# Patient Record
Sex: Male | Born: 1965 | Hispanic: Yes | Marital: Married
Health system: Southern US, Community
[De-identification: ages and names within clinical notes are randomized; demographics above are authoritative.]

## PROBLEM LIST (undated history)

## (undated) DIAGNOSIS — E785 Hyperlipidemia, unspecified: Secondary | ICD-10-CM

## (undated) HISTORY — PX: FOOT SURGERY: SHX648

---

## 2014-03-23 ENCOUNTER — Ambulatory Visit: Payer: Self-pay | Admitting: Family Medicine

## 2014-03-23 LAB — DOT URINE DIP
GLUCOSE, UR: NEGATIVE
PROTEIN: NEGATIVE
SPECIFIC GRAVITY: 1.01 (ref 1.000–1.030)

## 2018-03-18 ENCOUNTER — Emergency Department
Admission: EM | Admit: 2018-03-18 | Discharge: 2018-03-18 | Disposition: A | Discharge status: Home / Self Care | Payer: Worker's Compensation | Attending: Emergency Medicine | Admitting: Emergency Medicine

## 2018-03-18 ENCOUNTER — Emergency Department: Payer: Worker's Compensation

## 2018-03-18 ENCOUNTER — Other Ambulatory Visit: Payer: Self-pay

## 2018-03-18 ENCOUNTER — Encounter: Payer: Self-pay | Admitting: Emergency Medicine

## 2018-03-18 DIAGNOSIS — M546 Pain in thoracic spine: Secondary | ICD-10-CM | POA: Diagnosis not present

## 2018-03-18 DIAGNOSIS — Y990 Civilian activity done for income or pay: Secondary | ICD-10-CM | POA: Insufficient documentation

## 2018-03-18 DIAGNOSIS — G44319 Acute post-traumatic headache, not intractable: Secondary | ICD-10-CM | POA: Diagnosis not present

## 2018-03-18 DIAGNOSIS — Y9263 Factory as the place of occurrence of the external cause: Secondary | ICD-10-CM | POA: Insufficient documentation

## 2018-03-18 DIAGNOSIS — Y9389 Activity, other specified: Secondary | ICD-10-CM | POA: Insufficient documentation

## 2018-03-18 DIAGNOSIS — W228XXA Striking against or struck by other objects, initial encounter: Secondary | ICD-10-CM | POA: Diagnosis not present

## 2018-03-18 DIAGNOSIS — S1093XA Contusion of unspecified part of neck, initial encounter: Secondary | ICD-10-CM

## 2018-03-18 DIAGNOSIS — Z79899 Other long term (current) drug therapy: Secondary | ICD-10-CM | POA: Diagnosis not present

## 2018-03-18 DIAGNOSIS — S1083XA Contusion of other specified part of neck, initial encounter: Secondary | ICD-10-CM | POA: Diagnosis not present

## 2018-03-18 DIAGNOSIS — S161XXA Strain of muscle, fascia and tendon at neck level, initial encounter: Secondary | ICD-10-CM | POA: Diagnosis not present

## 2018-03-18 DIAGNOSIS — S0990XA Unspecified injury of head, initial encounter: Secondary | ICD-10-CM | POA: Diagnosis present

## 2018-03-18 HISTORY — DX: Hyperlipidemia, unspecified: E78.5

## 2018-03-18 MED ORDER — METHOCARBAMOL 500 MG PO TABS: 500.0000 mg | ORAL_TABLET | Freq: Four times a day (QID) | ORAL | 0 refills | Status: AC

## 2018-03-18 MED ORDER — MELOXICAM 15 MG PO TABS: 15.0000 mg | ORAL_TABLET | Freq: Every day | ORAL | 0 refills | Status: AC

## 2018-03-18 NOTE — ED Provider Notes (Signed)
[facility] Emergency Department Provider Note  ____________________________________________  Time seen: Approximately 3:05 PM  I have reviewed the triage vital signs and the nursing notes.   HISTORY  Chief Complaint Head Injury    HPI Andrew Weber is a 52 y.o. male who presents the emergency department complaining of headache, neck pain, mid back pain after injury at work.  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  Patient had a metal gate rolled down on top of him while he was underneath.  Patient reports that it struck him in the back of the neck.  Since then he has had a headache, neck pain, radicular symptoms of the left arm, mid back pain.  Patient denies any loss of consciousness, no medications for this complaint prior to arrival.  Patient denies any loss of strength bilateral upper extremities.  No shortness of breath, cough, difficulty breathing.  No other injury from this occurrence reported at this time.    Past Medical History:  Diagnosis Date  . Hyperlipidemia     There are no active problems to display for this patient.   Past Surgical History:  Procedure Laterality Date  . FOOT SURGERY      Prior to Admission medications   Medication Sig Start Date End Date Taking? Authorizing Provider  simvastatin (ZOCOR) 20 MG tablet Take 20 mg by mouth daily.   Yes [provider]  meloxicam (MOBIC) 15 MG tablet Take 1 tablet (15 mg total) by mouth daily. 03/18/18   Cuthriell, Delorise Royals, PA-C  methocarbamol (ROBAXIN) 500 MG tablet Take 1 tablet (500 mg total) by mouth 4 (four) times daily. 03/18/18   Cuthriell, Delorise Royals, PA-C    Allergies Patient has no known allergies.  History reviewed. No pertinent family history.  Social History Social History   Tobacco Use  . Smoking status: Never Smoker  . Smokeless tobacco: Never Used  Substance Use Topics  . Alcohol use: Never    Frequency: Never  .  Drug use: Never     Review of Systems  Constitutional: No fever/chills Eyes: No visual changes. Cardiovascular: no chest pain. Respiratory: no cough. No SOB. Gastrointestinal: No abdominal pain.  No nausea, no vomiting.  Musculoskeletal: Positive for neck pain Skin: Negative for rash, abrasions, lacerations, ecchymosis. Neurological: Positive for headache but denies focal weakness or numbness. 10-point ROS otherwise negative.  ____________________________________________   PHYSICAL EXAM:  VITAL SIGNS: ED Triage Vitals  Enc Vitals Group     BP 03/18/18 1431 140/80     Pulse Rate 03/18/18 1431 76     Resp 03/18/18 1431 20     Temp 03/18/18 1431 98.7 F (37.1 C)     Temp Source 03/18/18 1431 Oral     SpO2 03/18/18 1431 97 %     Weight 03/18/18 1432 214 lb (97.1 kg)     Height 03/18/18 1432 5\' 8"  (1.727 m)     Head Circumference --      Peak Flow --      Pain Score 03/18/18 1437 5     Pain Loc --      Pain Edu? --      Excl. in GC? --      Constitutional: Alert and oriented. Well appearing and in no acute distress. Eyes: Conjunctivae are normal. PERRL. EOMI. Head: Atraumatic. ENT:      Ears:       Nose: No congestion/rhinnorhea.      Mouth/Throat: Mucous membranes  are moist.  Neck: No stridor.  Midline cervical spine tenderness to palpation over C5 and C6.  No palpable abnormality or step-off.  No tenderness to palpation bilateral paraspinal muscle groups.  Radial pulse intact bilateral upper extremities.  Sensation intact and equal in all dermatomal distributions bilateral upper extremities..  Cardiovascular: Normal rate, regular rhythm. Normal S1 and S2.  Good peripheral circulation. Respiratory: Normal respiratory effort without tachypnea or retractions. Lungs CTAB. Good air entry to the bases with no decreased or absent breath sounds. Musculoskeletal: Full range of motion to all extremities. No gross deformities appreciated. Neurologic:  Normal speech and language.  No gross focal neurologic deficits are appreciated.  Skin:  Skin is warm, dry and intact. No rash noted. Psychiatric: Mood and affect are normal. Speech and behavior are normal. Patient exhibits appropriate insight and judgement.   ____________________________________________   LABS (all labs ordered are listed, but only abnormal results are displayed)  Labs Reviewed - No data to display ____________________________________________  EKG   ____________________________________________  RADIOLOGY I personally viewed and evaluated these images as part of my medical decision making, as well as reviewing the written report by the radiologist.  Dg Chest 2 View  Result Date: 03/18/2018 CLINICAL DATA:  Pain after hit by gate EXAM: CHEST - 2 VIEW COMPARISON:  None. FINDINGS: Lungs are clear. Heart size and pulmonary vascularity are normal. No adenopathy. No pneumothorax. No bone lesions. IMPRESSION: No abnormality appreciable. Electronically Signed   By: Bretta BangWilliam  Woodruff III M.D.   On: 03/18/2018 15:52   Ct Head Wo Contrast  Result Date: 03/18/2018 CLINICAL DATA:  Head and neck pain hit in back of head at work EXAM: CT HEAD WITHOUT CONTRAST CT CERVICAL SPINE WITHOUT CONTRAST TECHNIQUE: Multidetector CT imaging of the head and cervical spine was performed following the standard protocol without intravenous contrast. Multiplanar CT image reconstructions of the cervical spine were also generated. COMPARISON:  None. FINDINGS: CT HEAD FINDINGS Brain: No evidence of acute infarction, hemorrhage, hydrocephalus, extra-axial collection or mass lesion/mass effect. Vascular: No hyperdense vessel or unexpected calcification. Skull: Normal. Negative for fracture or focal lesion. Sinuses/Orbits: Mild mucosal thickening in the ethmoid sinuses. Other: None CT CERVICAL SPINE FINDINGS Alignment: Straightening of the cervical spine. No subluxation. Facet alignment within normal limits. Skull base and vertebrae: No  acute fracture. No primary bone lesion or focal pathologic process. Soft tissues and spinal canal: No prevertebral fluid or swelling. No visible canal hematoma. Disc levels:  Minimal degenerative change C5-C6 and C6-C7 Upper chest: Negative. Other: None IMPRESSION: 1. Negative non contrasted CT appearance of the brain 2. Straightening of the cervical spine. No acute osseous abnormality. Electronically Signed   By: Jasmine PangKim  Fujinaga M.D.   On: 03/18/2018 15:51   Ct Cervical Spine Wo Contrast  Result Date: 03/18/2018 CLINICAL DATA:  Head and neck pain hit in back of head at work EXAM: CT HEAD WITHOUT CONTRAST CT CERVICAL SPINE WITHOUT CONTRAST TECHNIQUE: Multidetector CT imaging of the head and cervical spine was performed following the standard protocol without intravenous contrast. Multiplanar CT image reconstructions of the cervical spine were also generated. COMPARISON:  None. FINDINGS: CT HEAD FINDINGS Brain: No evidence of acute infarction, hemorrhage, hydrocephalus, extra-axial collection or mass lesion/mass effect. Vascular: No hyperdense vessel or unexpected calcification. Skull: Normal. Negative for fracture or focal lesion. Sinuses/Orbits: Mild mucosal thickening in the ethmoid sinuses. Other: None CT CERVICAL SPINE FINDINGS Alignment: Straightening of the cervical spine. No subluxation. Facet alignment within normal limits. Skull base and vertebrae: No  acute fracture. No primary bone lesion or focal pathologic process. Soft tissues and spinal canal: No prevertebral fluid or swelling. No visible canal hematoma. Disc levels:  Minimal degenerative change C5-C6 and C6-C7 Upper chest: Negative. Other: None IMPRESSION: 1. Negative non contrasted CT appearance of the brain 2. Straightening of the cervical spine. No acute osseous abnormality. Electronically Signed   By: Jasmine PangKim  Fujinaga M.D.   On: 03/18/2018 15:51    ____________________________________________    PROCEDURES  Procedure(s) performed:     Procedures    Medications - No data to display   ____________________________________________   INITIAL IMPRESSION / ASSESSMENT AND PLAN / ED COURSE  Pertinent labs & imaging results that were available during my care of the patient were reviewed by me and considered in my medical decision making (see chart for details).  Review of the [place] CSRS was performed in accordance of the [facility] prior to dispensing any controlled drugs.      Patient's diagnosis is consistent with neck contusion, cervical strain, posttraumatic headache.  Patient presents the emergency department after being injured at work.  Overall, exam is reassuring.  Given patient's symptoms, imaging is obtained.  No acute osseous or intracranial abnormality on imaging.  Patient will be placed on meloxicam and Robaxin for symptom improvement.  Work restrictions given to patient..  Patient will follow primary care as needed.  Patient is given ED precautions to return to the ED for any worsening or new symptoms.     ____________________________________________  FINAL CLINICAL IMPRESSION(S) / ED DIAGNOSES  Final diagnoses:  Contusion of neck, initial encounter  Acute post-traumatic headache, not intractable  Strain of neck muscle, initial encounter      NEW MEDICATIONS STARTED DURING THIS VISIT:  ED Discharge Orders         Ordered    meloxicam (MOBIC) 15 MG tablet  Daily     03/18/18 1624    methocarbamol (ROBAXIN) 500 MG tablet  4 times daily     03/18/18 1624              This chart was dictated using voice recognition software/Dragon. Despite best efforts to proofread, errors can occur which can change the meaning. Any change was purely unintentional.    Racheal PatchesCuthriell, Jonathan D, PA-C 03/18/18 1635    Sharyn CreamerQuale, Mark, MD 03/18/18 2340

## 2018-03-18 NOTE — ED Notes (Signed)
See triage note  States he was hit in back of neck by a metal gate at work  Having pain to neck  Denies any LOC

## 2018-03-18 NOTE — ED Triage Notes (Signed)
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  This RN spoke with Gene Combs from [facility] phone # [phone] who states no lab work needed.  Pt denies LOC.

## 2019-08-03 ENCOUNTER — Ambulatory Visit: Payer: BC Managed Care – PPO | Attending: Internal Medicine

## 2019-08-03 DIAGNOSIS — Z20822 Contact with and (suspected) exposure to covid-19: Secondary | ICD-10-CM

## 2019-08-05 LAB — NOVEL CORONAVIRUS, NAA: SARS-CoV-2, NAA: NOT DETECTED

## 2019-11-21 ENCOUNTER — Ambulatory Visit: Payer: Self-pay | Attending: Internal Medicine

## 2019-11-21 DIAGNOSIS — Z23 Encounter for immunization: Secondary | ICD-10-CM

## 2019-11-21 NOTE — Progress Notes (Signed)
   Covid-19 Vaccination Clinic  Name:  Kyro Joswick    MRN: [id number] DOB: 28-Nov-1965  11/21/2019  Mr. Barot was observed post Covid-19 immunization for 15 minutes   without incident. He was provided with Vaccine Information Sheet and instruction to access the V-Safe system.   Mr. Beagle was instructed to call 911 with any severe reactions post vaccine: Marland Kitchen Difficulty breathing  . Swelling of face and throat  . A fast heartbeat  . A bad rash all over body  . Dizziness and weakness   Immunizations Administered    Name Date Dose VIS Date Route   Pfizer COVID-19 Vaccine 11/21/2019  3:11 PM 0.3 mL 07/17/2019 Intramuscular   Manufacturer: [employer], [employer]   Lot: [id number]   NDC: [address]-4

## 2019-12-12 ENCOUNTER — Ambulatory Visit: Payer: BC Managed Care – PPO | Attending: Internal Medicine

## 2019-12-12 DIAGNOSIS — Z23 Encounter for immunization: Secondary | ICD-10-CM

## 2019-12-12 NOTE — Progress Notes (Signed)
   Covid-19 Vaccination Clinic  Name:  Jobin Montelongo    MRN: [id number] DOB: 10-15-1965  12/12/2019  Mr. Maultsby was observed post Covid-19 immunization for 15 minutes without incident. He was provided with Vaccine Information Sheet and instruction to access the V-Safe system.   Mr. Mcparland was instructed to call 911 with any severe reactions post vaccine: Marland Kitchen Difficulty breathing  . Swelling of face and throat  . A fast heartbeat  . A bad rash all over body  . Dizziness and weakness   Immunizations Administered    Name Date Dose VIS Date Route   Pfizer COVID-19 Vaccine 12/12/2019 12:20 PM 0.3 mL 09/30/2018 Intramuscular   Manufacturer: [employer], [employer]   Lot: [id number]   NDC: [address]-2

## 2019-12-30 IMAGING — CT CT CERVICAL SPINE W/O CM
4 of 7 series · 13 of 33 positions shown, 14 images · non-contrast
Comparison: None.

CLINICAL DATA: Head and neck pain hit in back of head at work

EXAM:
CT HEAD WITHOUT CONTRAST
CT CERVICAL SPINE WITHOUT CONTRAST
TECHNIQUE: Multidetector CT imaging of the head and cervical spine was
performed following the standard protocol without intravenous
contrast. Multiplanar CT image reconstructions of the cervical spine
were also generated.

[Series 7: c spine soft · axial · 0.34mm/px · z∈[-278,-160]mm · 4 of 99 slices shown]
[im 20/99  soft-tissue]
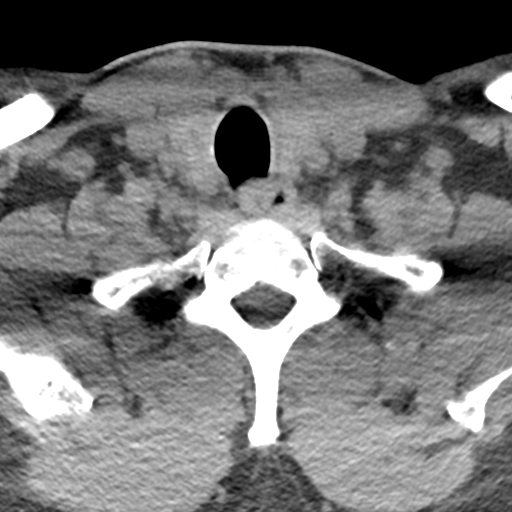
[im 40/99  soft-tissue]
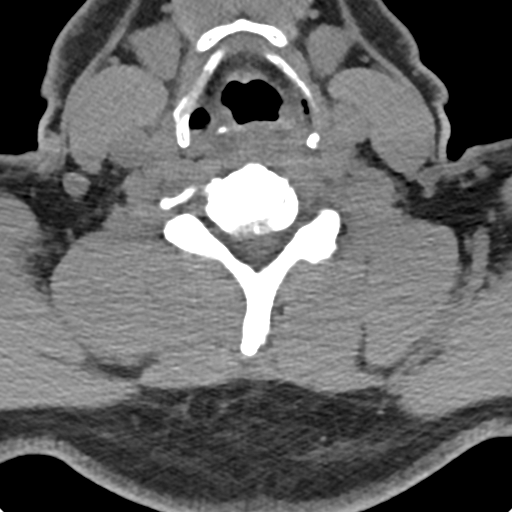
[im 59/99  soft-tissue]
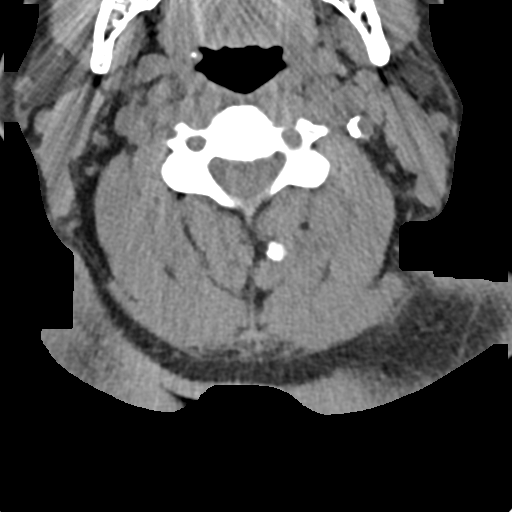
[im 79/99  soft-tissue]
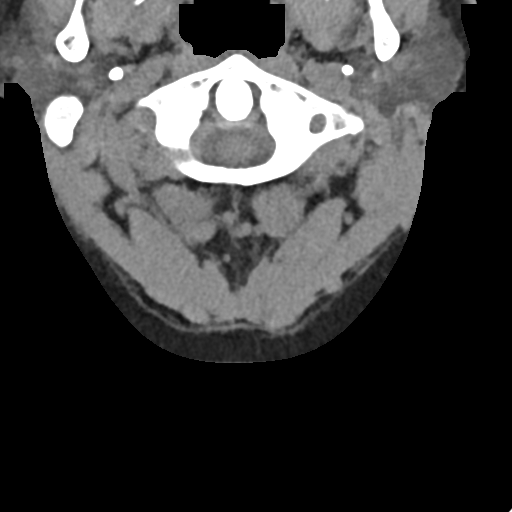

[Series 10: sagittal bone · sagittal · 0.23mm/px · 4 of 56 slices shown]
[im 12/56  bone]
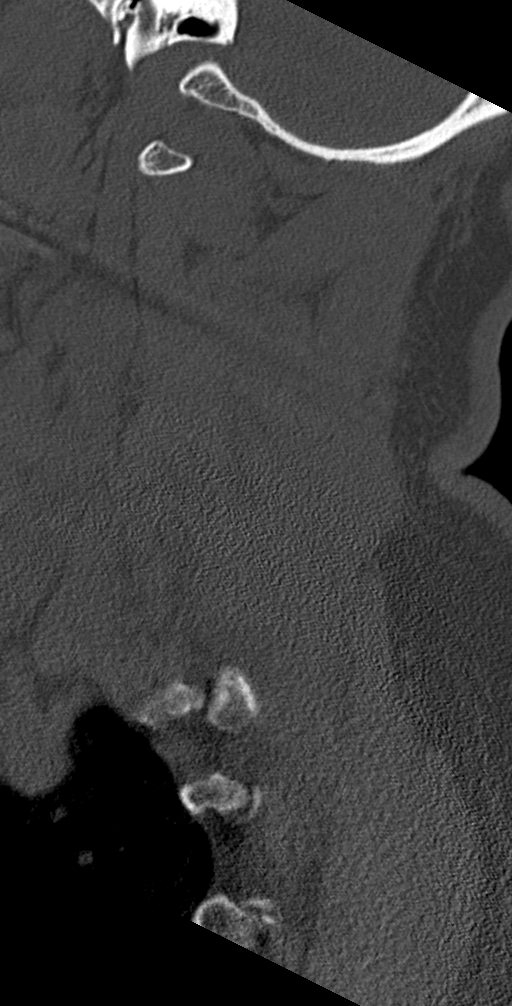
[im 23/56  bone]
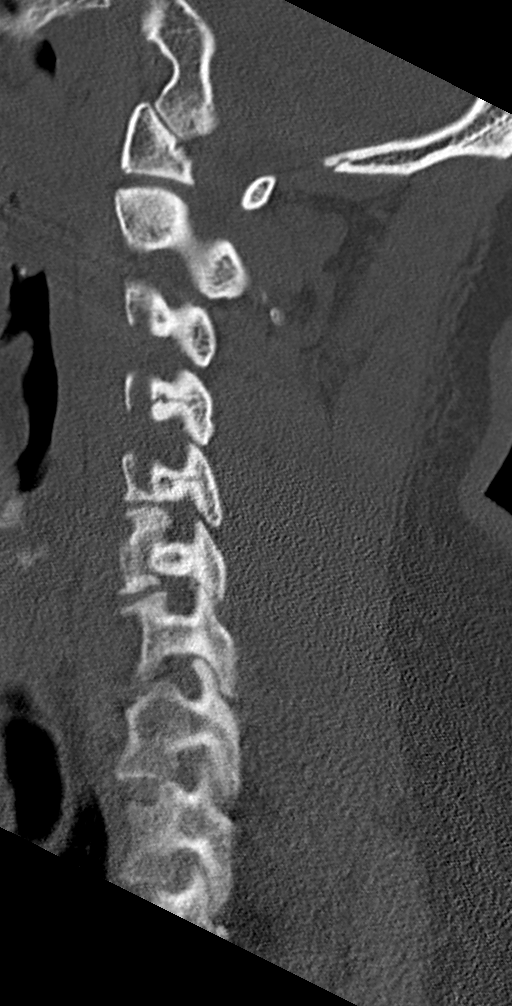
[im 34/56  bone]
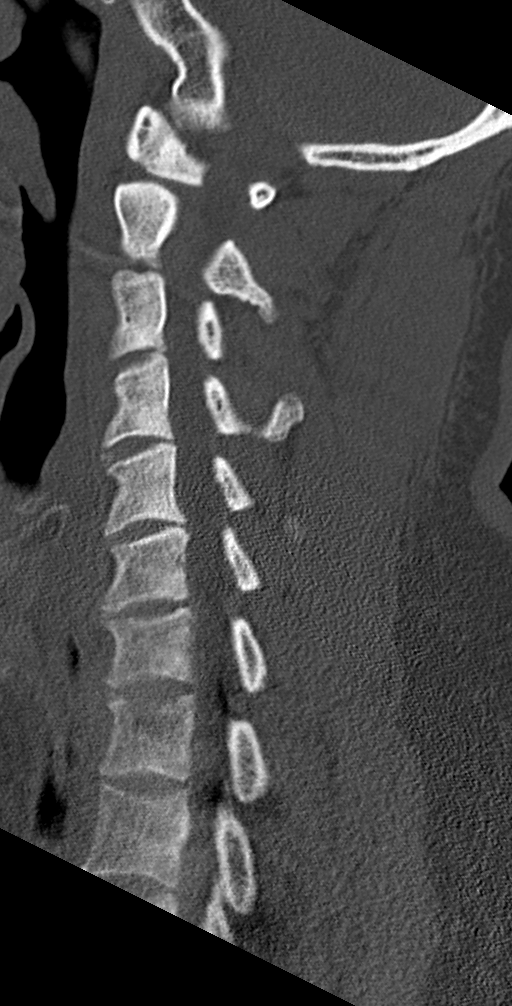
[im 45/56  bone]
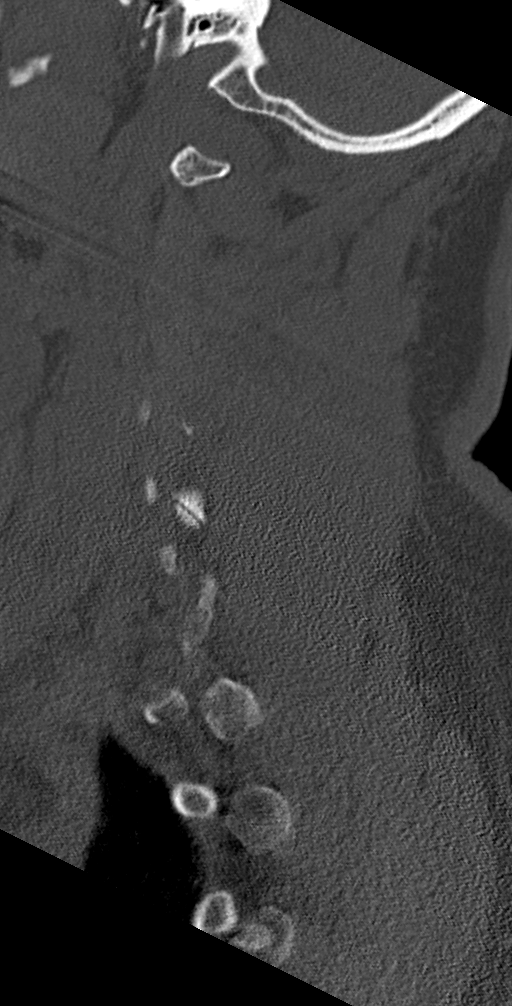

[Series 11: coronal bone · coronal · 0.25mm/px · 1 of 50 slices shown]
[im 25/50  bone]
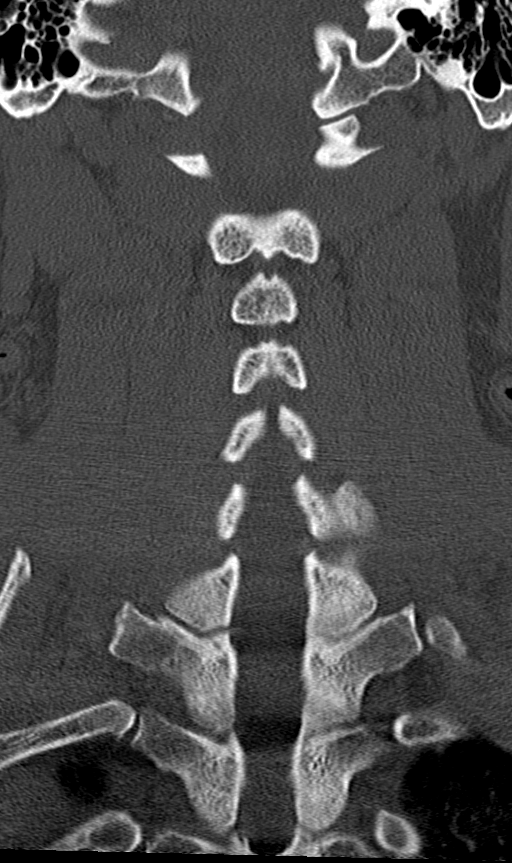

[Series 12: orthogonal bone · axial · 0.22mm/px · z∈[-305,-192]mm · 4 of 105 slices shown, 5 images]
[im 21/105  soft-tissue]
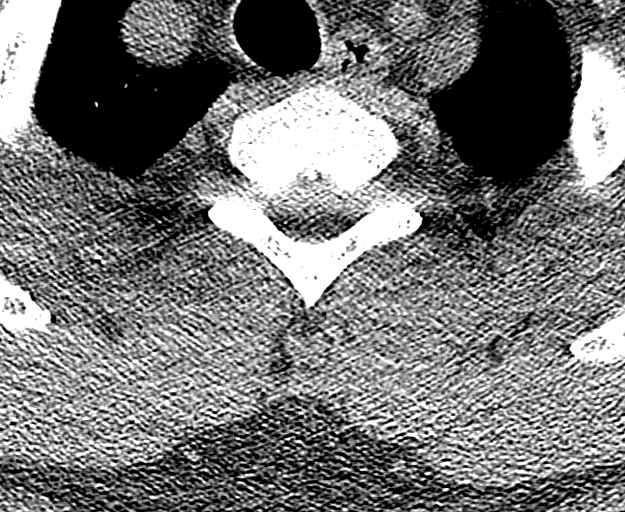
[im 21/105  bone]
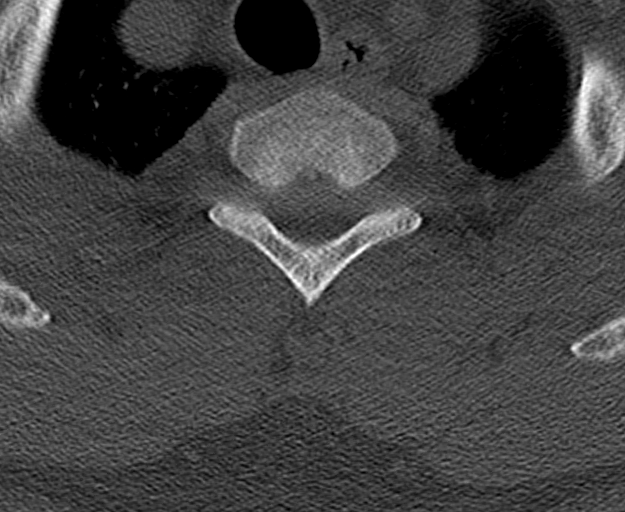
[im 42/105  bone]
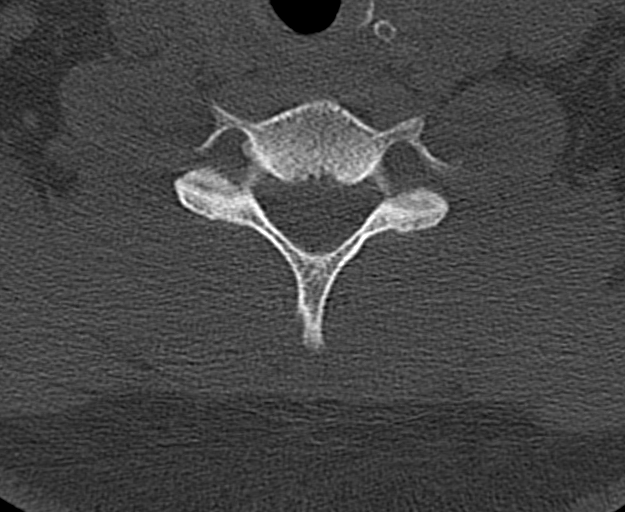
[im 63/105  bone]
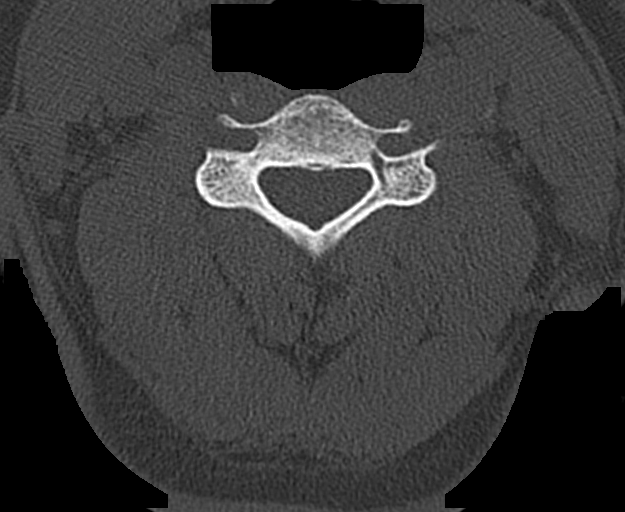
[im 84/105  bone]
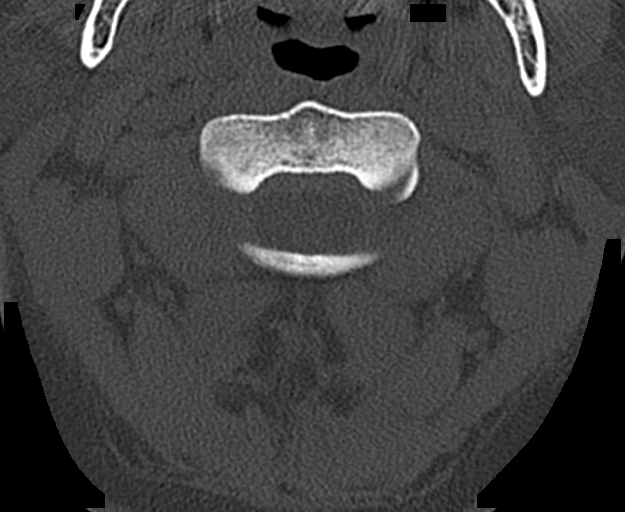

[13 of 33 positions shown; findings below may reference images not displayed]

FINDINGS: CT HEAD FINDINGS

Brain: No evidence of acute infarction, hemorrhage, hydrocephalus,
extra-axial collection or mass lesion/mass effect.

Vascular: No hyperdense vessel or unexpected calcification.

Skull: Normal. Negative for fracture or focal lesion.

Sinuses/Orbits: Mild mucosal thickening in the ethmoid sinuses.

Other: None

CT CERVICAL SPINE FINDINGS

Alignment: Straightening of the cervical spine. No subluxation.
Facet alignment within normal limits.

Skull base and vertebrae: No acute fracture. No primary bone lesion
or focal pathologic process.

Soft tissues and spinal canal: No prevertebral fluid or swelling. No
visible canal hematoma.

Disc levels:  Minimal degenerative change C5-C6 and C6-C7

Upper chest: Negative.

Other: None
IMPRESSION: 1. Negative non contrasted CT appearance of the brain
2. Straightening of the cervical spine. No acute osseous
abnormality.

## 2020-05-26 ENCOUNTER — Ambulatory Visit
Admission: RE | Admit: 2020-05-26 | Discharge: 2020-05-26 | Disposition: A | Discharge status: Home / Self Care | Payer: Worker's Compensation | Source: Ambulatory Visit | Attending: Physician Assistant | Admitting: Physician Assistant

## 2020-05-26 ENCOUNTER — Other Ambulatory Visit: Payer: Self-pay | Admitting: Physician Assistant

## 2020-05-26 ENCOUNTER — Ambulatory Visit
Admission: RE | Admit: 2020-05-26 | Discharge: 2020-05-26 | Disposition: A | Discharge status: Home / Self Care | Payer: Worker's Compensation | Attending: *Deleted | Admitting: *Deleted

## 2020-05-26 ENCOUNTER — Other Ambulatory Visit: Payer: Self-pay

## 2020-05-26 DIAGNOSIS — M25571 Pain in right ankle and joints of right foot: Secondary | ICD-10-CM

## 2021-03-01 DIAGNOSIS — Z139 Encounter for screening, unspecified: Secondary | ICD-10-CM

## 2021-03-01 LAB — GLUCOSE, POCT (MANUAL RESULT ENTRY): POC Glucose: 88 mg/dl (ref 70–99)

## 2021-03-01 NOTE — Congregational Nurse Program (Signed)
  Dept: [phone]   Congregational Nurse Program Note  Date of Encounter: 03/01/2021  Past Medical History: Past Medical History:  Diagnosis Date   Hyperlipidemia     Encounter Details:  CNP Questionnaire - 03/01/21 1344       Questionnaire   Do you give verbal consent to treat you today? 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

## 2022-03-09 IMAGING — CR DG ANKLE COMPLETE 3+V*R*
3 series · 3 of 3 positions shown · non-contrast
Comparison: None.

CLINICAL DATA: Right ankle pain after injury at work today.

EXAM:
RIGHT ANKLE - COMPLETE 3+ VIEW

[ankle ap]
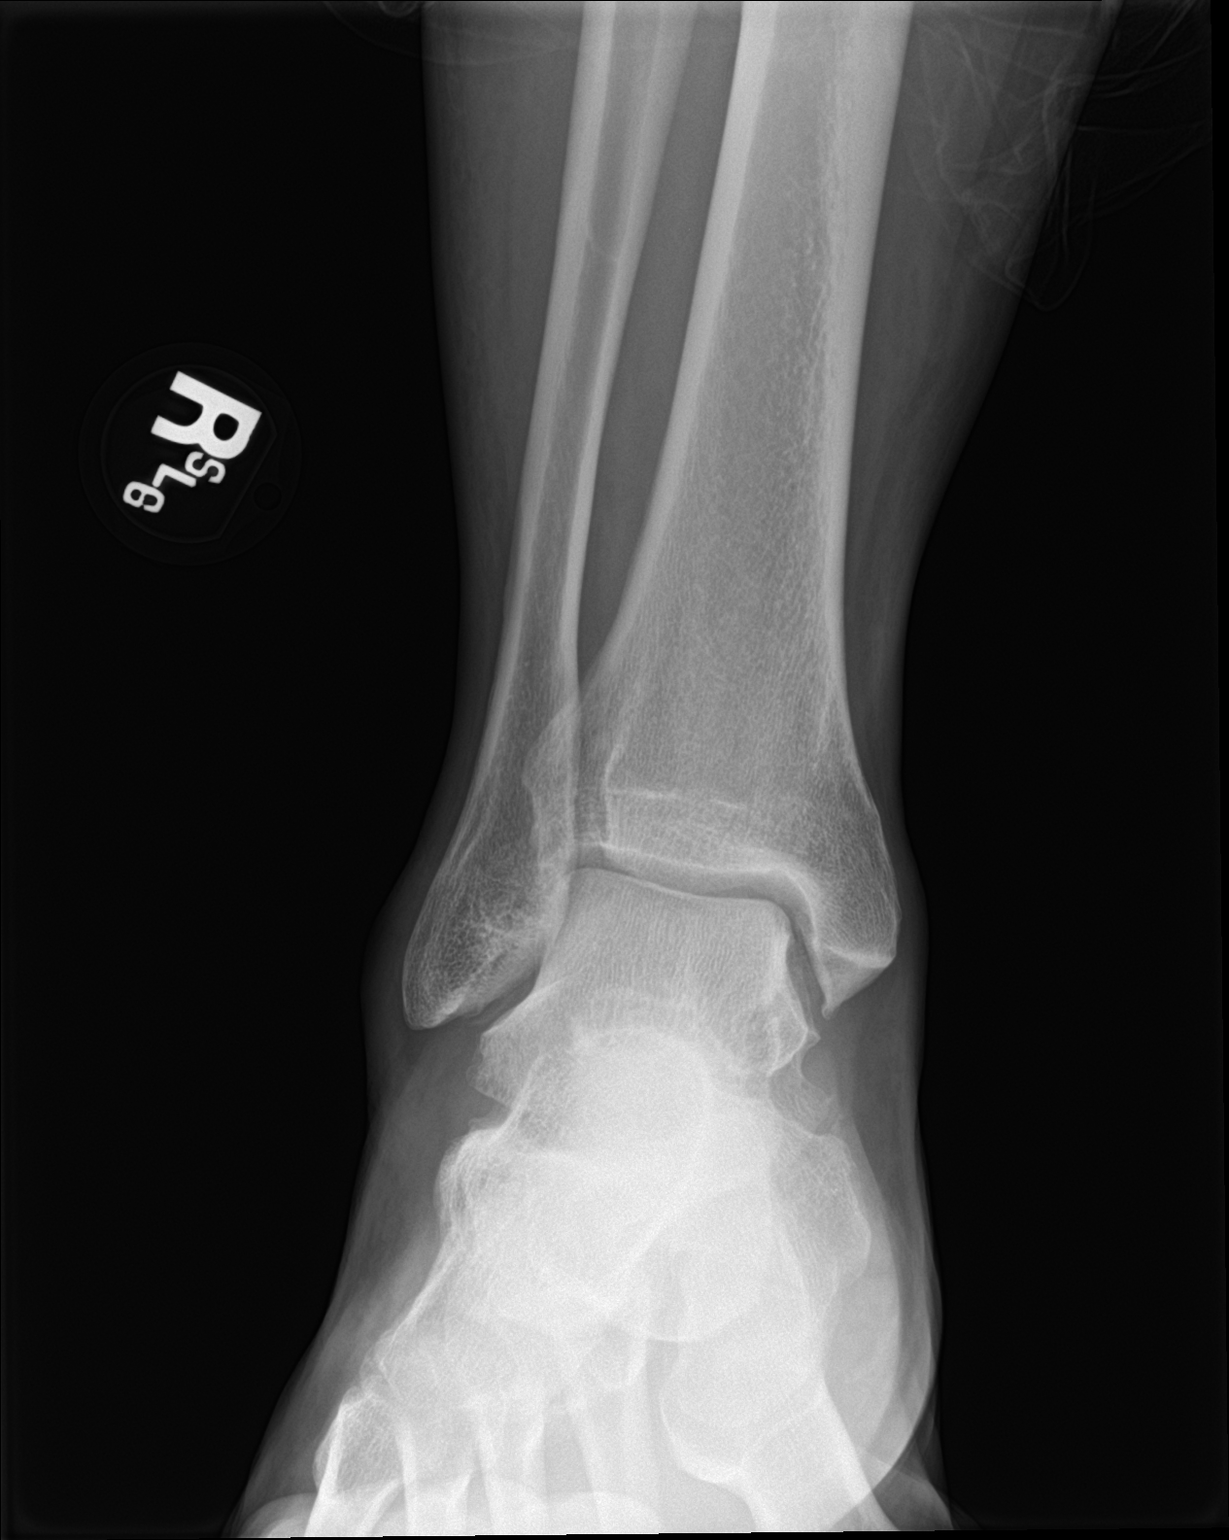

[ankle obl]
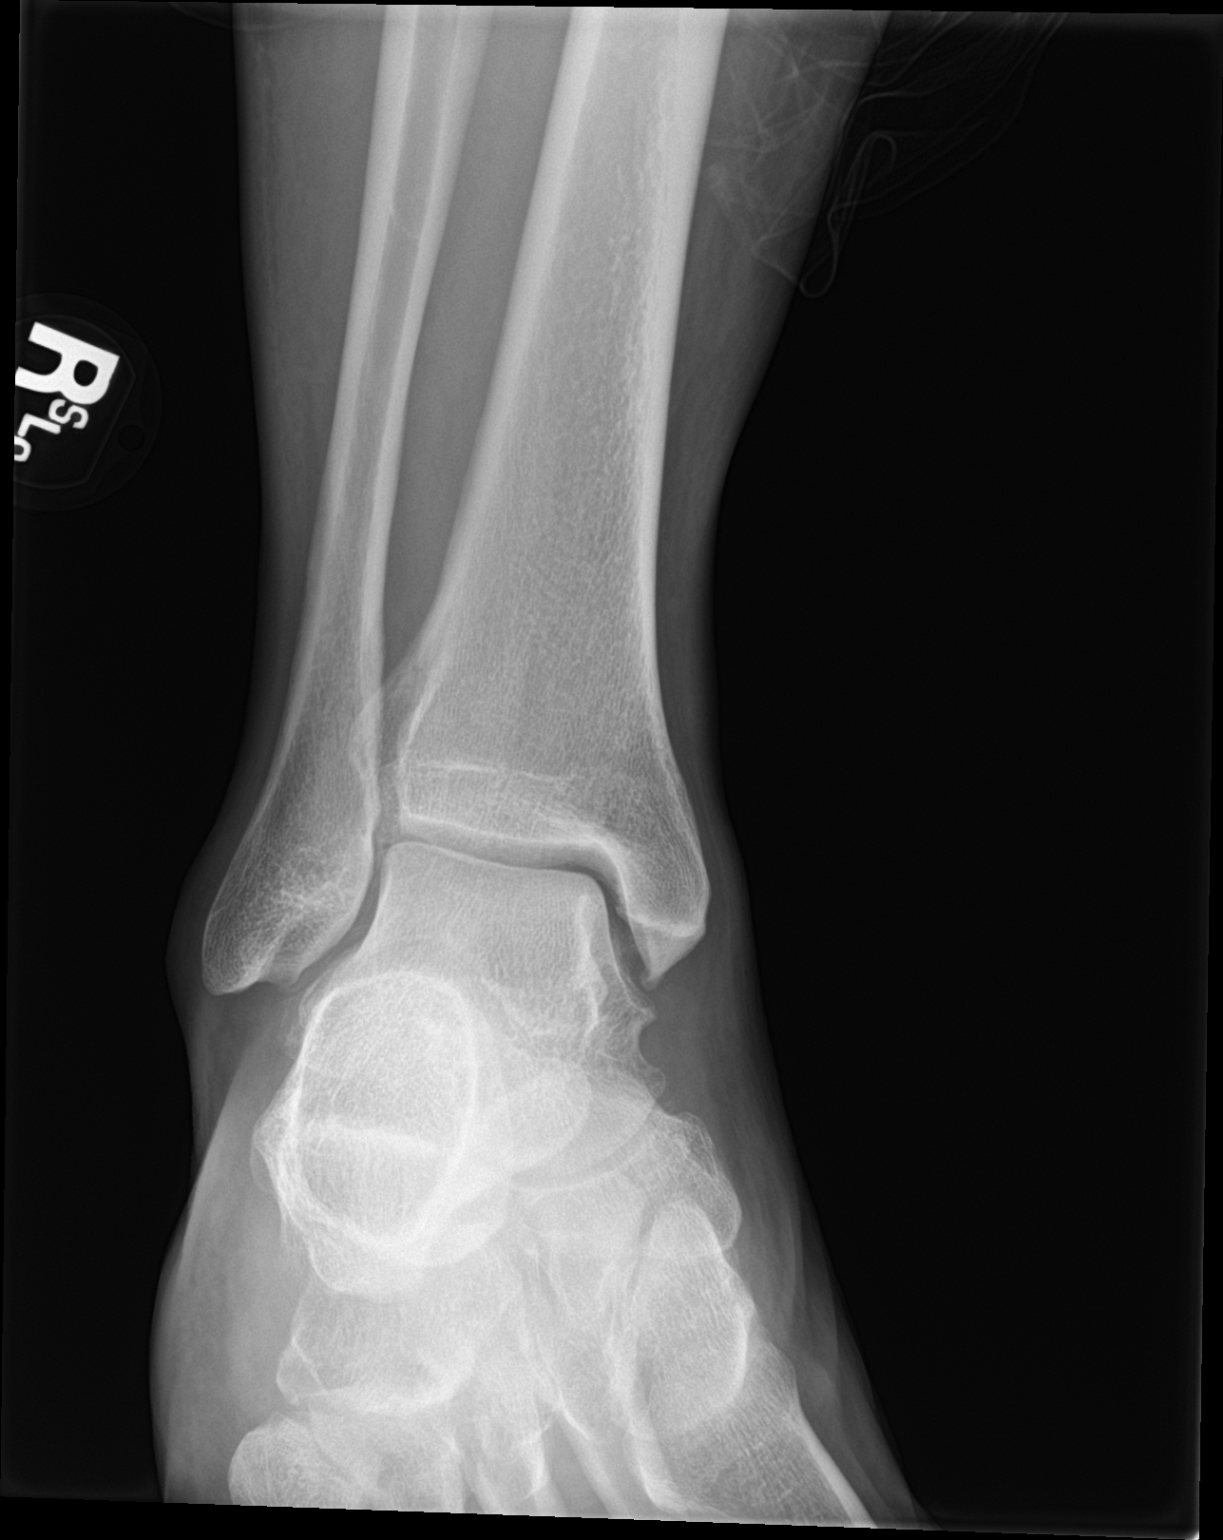

[ankle lat]
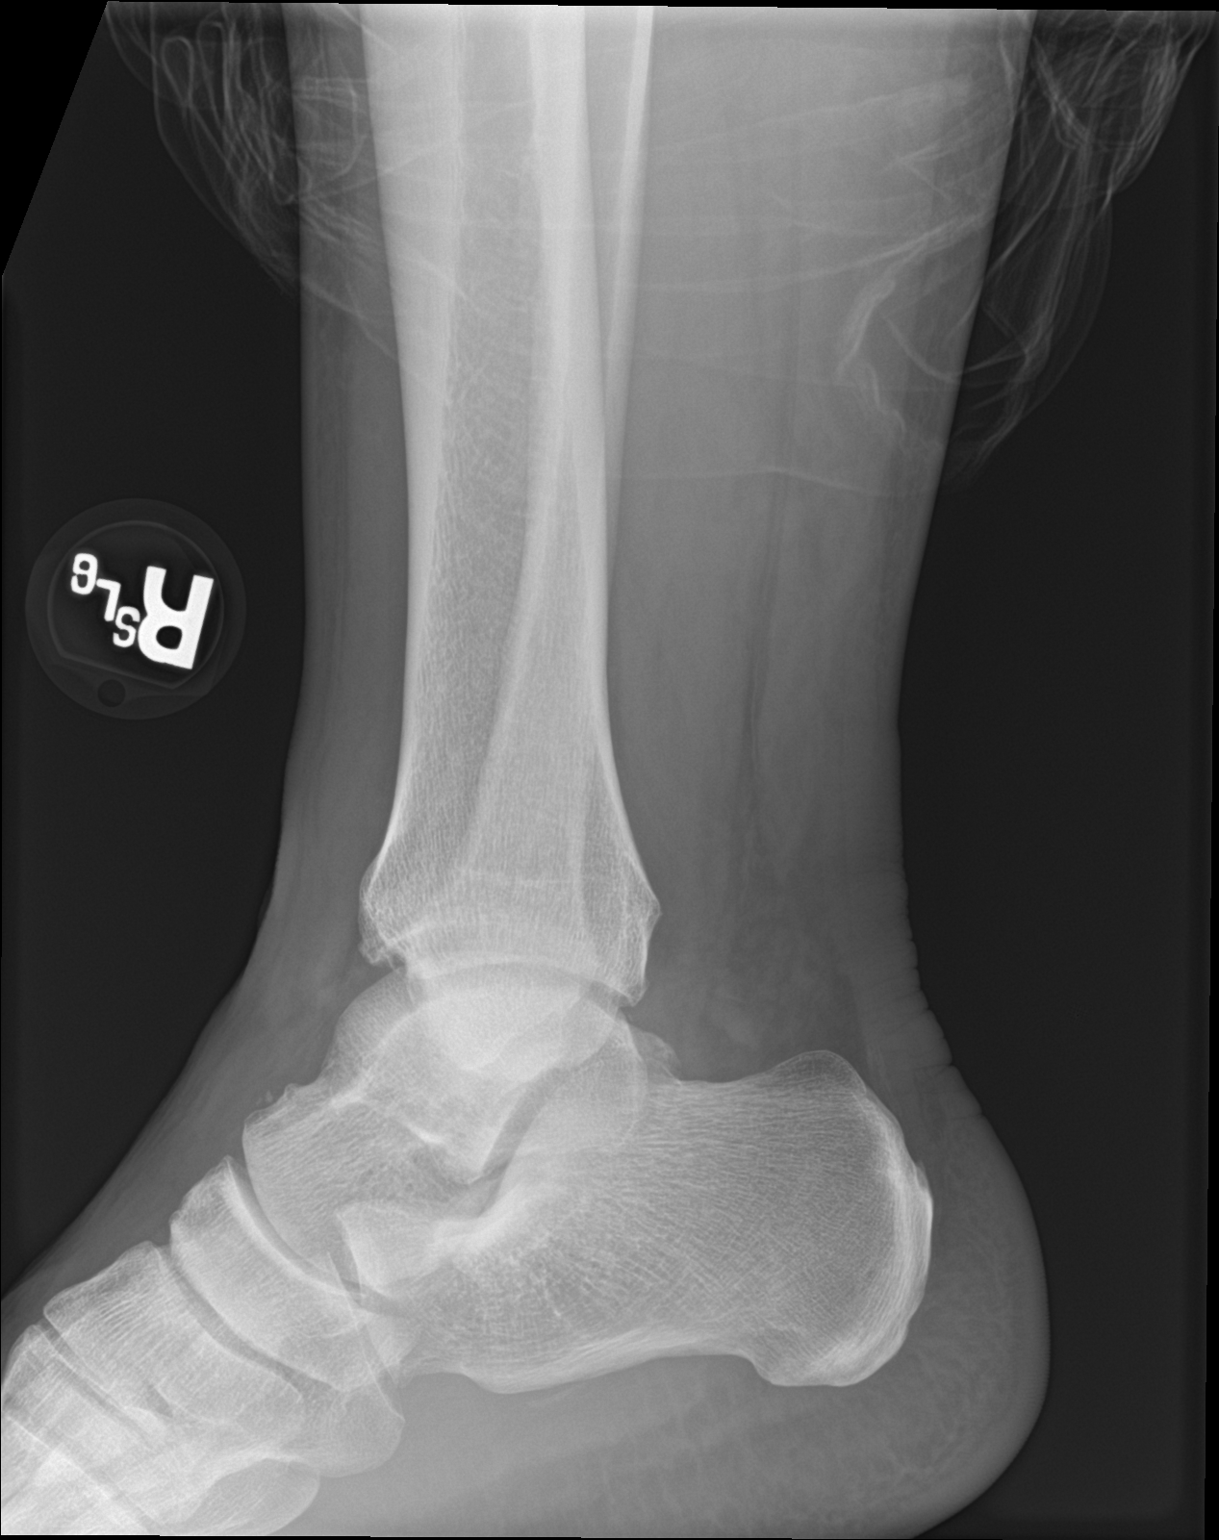

[3 of 3 positions shown; findings below may reference images not displayed]

FINDINGS: There is no evidence of fracture, dislocation, or joint effusion.
There is no evidence of arthropathy or other focal bone abnormality.
Soft tissues are unremarkable.
IMPRESSION: Negative.
